# Patient Record
Sex: Male | Born: 1990 | Race: Black or African American | Hispanic: No | Marital: Single | Smoking: Never smoker
Health system: Southern US, Community
[De-identification: ages and names within clinical notes are randomized; demographics above are authoritative.]

## PROBLEM LIST (undated history)

## (undated) DIAGNOSIS — F419 Anxiety disorder, unspecified: Secondary | ICD-10-CM

## (undated) DIAGNOSIS — K219 Gastro-esophageal reflux disease without esophagitis: Secondary | ICD-10-CM

## (undated) DIAGNOSIS — F1010 Alcohol abuse, uncomplicated: Secondary | ICD-10-CM

## (undated) DIAGNOSIS — J45909 Unspecified asthma, uncomplicated: Secondary | ICD-10-CM

## (undated) HISTORY — DX: Gastro-esophageal reflux disease without esophagitis: K21.9

## (undated) HISTORY — DX: Anxiety disorder, unspecified: F41.9

## (undated) HISTORY — DX: Alcohol abuse, uncomplicated: F10.10

---
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
## 2011-01-02 LAB — CBC
HCT: 44.6 % (ref 39.0–52.0)
Hemoglobin: 14.9 g/dL (ref 13.0–17.0)
MCV: 87.4 fL (ref 78.0–100.0)
Platelets: 278 10*3/uL (ref 150–400)
RBC: 5.1 MIL/uL (ref 4.22–5.81)
RDW: 13 % (ref 11.5–15.5)
WBC: 9.7 10*3/uL (ref 4.0–10.5)

## 2011-01-02 LAB — DIFFERENTIAL
Eosinophils Absolute: 0.1 10*3/uL (ref 0.0–0.7)
Eosinophils Relative: 1 % (ref 0–5)
Monocytes Relative: 8 % (ref 3–12)
Neutro Abs: 6.6 10*3/uL (ref 1.7–7.7)

## 2011-01-02 LAB — D-DIMER, QUANTITATIVE: D-Dimer, Quant: <0.22 ug/mL-FEU (ref 0.00–0.48)

## 2011-04-13 ENCOUNTER — Emergency Department (HOSPITAL_BASED_OUTPATIENT_CLINIC_OR_DEPARTMENT_OTHER)
Admission: EM | Admit: 2011-04-13 | Discharge: 2011-04-13 | Disposition: A | Discharge status: Home / Self Care | Payer: PRIVATE HEALTH INSURANCE | Attending: Emergency Medicine | Admitting: Emergency Medicine

## 2011-04-13 DIAGNOSIS — J45909 Unspecified asthma, uncomplicated: Secondary | ICD-10-CM | POA: Insufficient documentation

## 2011-04-13 DIAGNOSIS — S90569A Insect bite (nonvenomous), unspecified ankle, initial encounter: Secondary | ICD-10-CM | POA: Insufficient documentation

## 2013-05-03 ENCOUNTER — Emergency Department (HOSPITAL_COMMUNITY)
Admission: EM | Admit: 2013-05-03 | Discharge: 2013-05-03 | Disposition: A | Discharge status: Home / Self Care | Payer: BC Managed Care – PPO | Attending: Emergency Medicine | Admitting: Emergency Medicine

## 2013-05-03 DIAGNOSIS — Z79899 Other long term (current) drug therapy: Secondary | ICD-10-CM | POA: Insufficient documentation

## 2013-05-03 DIAGNOSIS — J45909 Unspecified asthma, uncomplicated: Secondary | ICD-10-CM | POA: Insufficient documentation

## 2013-05-03 DIAGNOSIS — R110 Nausea: Secondary | ICD-10-CM

## 2013-05-03 DIAGNOSIS — M25569 Pain in unspecified knee: Secondary | ICD-10-CM | POA: Insufficient documentation

## 2013-05-03 DIAGNOSIS — R51 Headache: Secondary | ICD-10-CM | POA: Insufficient documentation

## 2013-05-03 DIAGNOSIS — M25561 Pain in right knee: Secondary | ICD-10-CM

## 2013-05-03 DIAGNOSIS — Z87891 Personal history of nicotine dependence: Secondary | ICD-10-CM | POA: Insufficient documentation

## 2013-05-03 MED ORDER — ONDANSETRON 8 MG PO TBDP
8.0000 mg | ORAL_TABLET | Freq: Once | ORAL | Status: AC
  Administered 2013-05-03: 8 mg via ORAL
  Filled 2013-05-03: qty 1

## 2013-05-03 MED ORDER — KETOROLAC TROMETHAMINE 60 MG/2ML IM SOLN
60.0000 mg | Freq: Once | INTRAMUSCULAR | Status: AC
  Administered 2013-05-03: 60 mg via INTRAMUSCULAR
  Filled 2013-05-03: qty 2

## 2013-05-03 NOTE — ED Notes (Signed)
Pt arrives with multiple list of symptoms on his iPad. Pt has multiple complaints including knee pain, nausea, headache, and forgetfulness. Pt denies any trauma to R knee. Pt also c/o nausea, but states "i won't allow myself to vomit." Pt a/o x 4. Pt ambulatory to exam room with steady gait. Pt arrives with family members.

## 2013-05-03 NOTE — ED Provider Notes (Signed)
History  This chart was scribed for non-physician practitioner Roxy Horseman, PA-C working with Raeford Razor, MD, by Candelaria Stagers, ED Scribe. This patient was seen in room WTR6/WTR6 and the patient's care was started at 8:43 PM  CSN: [id number] Arrival date & time 05/03/13  2017  First MD Initiated Contact with Patient 05/03/13 2040     Chief Complaint  Patient presents with  . Multiple Complaints    The history is provided by the patient. No language interpreter was used.   HPI Comments: Landin Tallon is a 22 y.o. male who presents to the Emergency Department complaining of a right sided headache that started about one week ago and became worse today.  He is experiencing associated nausea.  Pt also complains of right knee pain that started about one week ago.  He denies recent injury or trauma.  He reports he has seen his PCP for these complaints about 6 days ago with no relief.  Nothing seems to make the sx better or worse.  Pt is ambulatory.     No past medical history on file. No past surgical history on file. No family history on file. History  Substance Use Topics  . Smoking status: Not on file  . Smokeless tobacco: Not on file  . Alcohol Use: Not on file    Review of Systems  Gastrointestinal: Positive for nausea.  Musculoskeletal: Positive for arthralgias (right knee pain).  Neurological: Positive for headaches.  All other systems reviewed and are negative.    Allergies  Review of patient's allergies indicates not on file.  Home Medications   Current Outpatient Rx  Name  Route  Sig  Dispense  Refill  . omeprazole (PRILOSEC) 20 MG capsule   Oral   Take 20 mg by mouth daily.          BP 128/65  Pulse 67  Temp(Src) 99.2 F (37.3 C) (Oral)  Resp 16  SpO2 100% Physical Exam  Nursing note and vitals reviewed. Constitutional: He is oriented to person, place, and time. He appears well-developed and well-nourished. No distress.  HENT:  Head:  Normocephalic and atraumatic.  Right Ear: External ear normal.  Left Ear: External ear normal.  Nose: Nose normal.  Mouth/Throat: Oropharynx is clear and moist. No oropharyngeal exudate.  Eyes: Conjunctivae and EOM are normal. Pupils are equal, round, and reactive to light. Right eye exhibits no discharge. Left eye exhibits no discharge. No scleral icterus.  Neck: Normal range of motion. Neck supple. No JVD present. No tracheal deviation present.  Cardiovascular: Normal rate, regular rhythm, normal heart sounds and intact distal pulses.  Exam reveals no gallop and no friction rub.   No murmur heard. Pulmonary/Chest: Effort normal and breath sounds normal. No respiratory distress. He has no wheezes. He has no rales. He exhibits no tenderness.  Abdominal: Soft. Bowel sounds are normal. He exhibits no distension and no mass. There is no tenderness. There is no rebound and no guarding.  Musculoskeletal: Normal range of motion. He exhibits no edema and no tenderness.  Right knee nontender to palpation, no bony abnormality or deformity, range of motion and strength 5/5, patient able to ambulate appropriately  Neurological: He is alert and oriented to person, place, and time. He has normal reflexes.  CN 3-12 intact, sensation and strength intact,  Skin: Skin is warm and dry.  Psychiatric: He has a normal mood and affect. His behavior is normal. Judgment and thought content normal.    ED Course  Procedures  DIAGNOSTIC STUDIES: Oxygen Saturation is 100% on room air, normal by my interpretation.    COORDINATION OF CARE:  8:48 PM Discussed course of care with pt which includes toradol and zofran.  Advised follow up with PCP.  Pt understands and agrees.    Labs Reviewed - No data to display No results found. 1. Headache   2. Nausea   3. Knee pain, right     MDM  Patient with multiple complaints. He has a list on his eye pad involve the symptoms he has been experiencing in the past week. He  is been seen by his primary care doctor this week, who is running blood tests. Suspicious of hypochondriasis, as the patient is having multiple complaints through multiple systems. I reassured the patient that nothing emergent or life-threatening is going on, will treat the patient's nausea and pain in the emergency department. He is ambulatory. He is well appearing and not in any apparent distress. Followup with PCP. He is stable and ready for discharge.   I personally performed the services described in this documentation, which was scribed in my presence. The recorded information has been reviewed and is accurate.     Roxy Horseman, PA-C 05/03/13 2056

## 2013-05-05 ENCOUNTER — Encounter (HOSPITAL_COMMUNITY): Payer: Self-pay | Admitting: Nurse Practitioner

## 2013-05-05 ENCOUNTER — Emergency Department (HOSPITAL_COMMUNITY)
Admission: EM | Admit: 2013-05-05 | Discharge: 2013-05-06 | Disposition: A | Discharge status: Home / Self Care | Payer: BC Managed Care – PPO | Attending: Emergency Medicine | Admitting: Emergency Medicine

## 2013-05-05 ENCOUNTER — Emergency Department (HOSPITAL_COMMUNITY): Payer: BC Managed Care – PPO

## 2013-05-05 DIAGNOSIS — R109 Unspecified abdominal pain: Secondary | ICD-10-CM | POA: Insufficient documentation

## 2013-05-05 DIAGNOSIS — R42 Dizziness and giddiness: Secondary | ICD-10-CM | POA: Insufficient documentation

## 2013-05-05 DIAGNOSIS — J45909 Unspecified asthma, uncomplicated: Secondary | ICD-10-CM | POA: Insufficient documentation

## 2013-05-05 DIAGNOSIS — F29 Unspecified psychosis not due to a substance or known physiological condition: Secondary | ICD-10-CM | POA: Insufficient documentation

## 2013-05-05 DIAGNOSIS — Z79899 Other long term (current) drug therapy: Secondary | ICD-10-CM | POA: Insufficient documentation

## 2013-05-05 DIAGNOSIS — R197 Diarrhea, unspecified: Secondary | ICD-10-CM | POA: Insufficient documentation

## 2013-05-05 DIAGNOSIS — F411 Generalized anxiety disorder: Secondary | ICD-10-CM | POA: Insufficient documentation

## 2013-05-05 DIAGNOSIS — Z87891 Personal history of nicotine dependence: Secondary | ICD-10-CM | POA: Insufficient documentation

## 2013-05-05 DIAGNOSIS — R259 Unspecified abnormal involuntary movements: Secondary | ICD-10-CM | POA: Insufficient documentation

## 2013-05-05 DIAGNOSIS — F419 Anxiety disorder, unspecified: Secondary | ICD-10-CM

## 2013-05-05 DIAGNOSIS — F1010 Alcohol abuse, uncomplicated: Secondary | ICD-10-CM

## 2013-05-05 HISTORY — DX: Unspecified asthma, uncomplicated: J45.909

## 2013-05-05 LAB — CBC WITH DIFFERENTIAL/PLATELET
Basophils Absolute: 0 10*3/uL (ref 0.0–0.1)
Basophils Relative: 0 % (ref 0–1)
Eosinophils Absolute: 0 10*3/uL (ref 0.0–0.7)
HCT: 46.6 % (ref 39.0–52.0)
Hemoglobin: 16 g/dL (ref 13.0–17.0)
Monocytes Absolute: 0.7 10*3/uL (ref 0.1–1.0)
Monocytes Relative: 8 % (ref 3–12)
Neutrophils Relative %: 77 % (ref 43–77)
RDW: 13.4 % (ref 11.5–15.5)
WBC: 8.5 10*3/uL (ref 4.0–10.5)

## 2013-05-05 LAB — RAPID URINE DRUG SCREEN, HOSP PERFORMED
Amphetamines: NOT DETECTED
Barbiturates: NOT DETECTED

## 2013-05-05 LAB — BASIC METABOLIC PANEL
BUN: 7 mg/dL (ref 6–23)
CO2: 30 mEq/L (ref 19–32)
Chloride: 100 mEq/L (ref 96–112)
GFR calc Af Amer: >90 mL/min (ref >90)
Glucose, Bld: 88 mg/dL (ref 70–99)

## 2013-05-05 LAB — ACETAMINOPHEN LEVEL: Acetaminophen (Tylenol), Serum: <15 ug/mL (ref 10–30)

## 2013-05-05 LAB — ETHANOL: Alcohol, Ethyl (B): <11 mg/dL (ref 0–11)

## 2013-05-05 LAB — URINALYSIS, ROUTINE W REFLEX MICROSCOPIC
Hgb urine dipstick: NEGATIVE
Ketones, ur: NEGATIVE mg/dL
Nitrite: NEGATIVE
Specific Gravity, Urine: 1.008 (ref 1.005–1.030)
pH: 7 (ref 5.0–8.0)

## 2013-05-05 LAB — SALICYLATE LEVEL: Salicylate Lvl: <2 mg/dL — ABNORMAL LOW (ref 2.8–20.0)

## 2013-05-05 MED ORDER — LORAZEPAM 1 MG PO TABS: 1.0000 mg | ORAL_TABLET | Freq: Three times a day (TID) | ORAL | Status: DC | PRN

## 2013-05-05 MED ORDER — LORAZEPAM 2 MG/ML IJ SOLN: 2.0000 mg | Freq: Once | INTRAMUSCULAR | Status: DC

## 2013-05-05 MED ORDER — LORAZEPAM 2 MG/ML IJ SOLN
1.0000 mg | Freq: Once | INTRAMUSCULAR | Status: AC
  Administered 2013-05-05: 1 mg via INTRAVENOUS
  Filled 2013-05-05: qty 1

## 2013-05-05 MED ORDER — IBUPROFEN 800 MG PO TABS
800.0000 mg | ORAL_TABLET | Freq: Once | ORAL | Status: AC
  Administered 2013-05-05: 800 mg via ORAL
  Filled 2013-05-05: qty 1

## 2013-05-05 MED ORDER — SODIUM CHLORIDE 0.9 % IV BOLUS (SEPSIS)
1000.0000 mL | Freq: Once | INTRAVENOUS | Status: AC
  Administered 2013-05-05: 1000 mL via INTRAVENOUS

## 2013-05-05 MED ORDER — LORAZEPAM 1 MG PO TABS
1.0000 mg | ORAL_TABLET | Freq: Once | ORAL | Status: AC
  Administered 2013-05-05: 1 mg via ORAL
  Filled 2013-05-05: qty 1

## 2013-05-05 MED ORDER — ONDANSETRON HCL 4 MG/2ML IJ SOLN: 4.0000 mg | Freq: Once | INTRAMUSCULAR | Status: DC

## 2013-05-05 NOTE — ED Provider Notes (Signed)
Care assumed from [facility], [place].    George Pope is a 22 y.o. male with several days of tremors, confusion and anxiety.  Pt with 3 days binge episode ( 2 40oz and 1 pt of liquor q day).  Likely EtOH withdrawal ssx.  In with mother and brother.  No tremors on exam.  Plan: Ativan ordered.  Telepsyc ordered for discussion of anxiety meds. D/c Home with ativan (1mg  TID PRN for anxiety or withdrawal ssx). [facility] follow-up (pt is home for summer at then to [facility]).     6:21 PM Pt with some improvement in symptoms with ativan.  Pt states he still requires active calming techniques when he hears people talking "medical lingo" but is able to accomplish this himself.  Awaiting telepsyc.    10:38 PM Telepsyc recommends outpatient EtOH and Drug rehab.  ACT team consulted and will evaluate patient prior to discharge.  Will d/c home with ativan for anxiety/withdrawl symptoms and outpatient resources.    I explained the diagnosis and have given explicit precautions to return to the ER including for any other new or worsening symptoms. The patient understands and accepts the medical plan as it's been dictated and I have answered their questions. Discharge instructions concerning home care and prescriptions have been given. The patient is STABLE and is discharged to home in good condition.    Dahlia Client Averyana Pillars, PA-C 05/05/13 2239

## 2013-05-05 NOTE — ED Provider Notes (Signed)
I was available for consult during the completion of this patient's course.  Gerhard Munch, MD 05/05/13 2318

## 2013-05-05 NOTE — ED Notes (Signed)
Patient transported to CT 

## 2013-05-05 NOTE — ED Notes (Signed)
Confirmed fax number ([phone]) with Mikle Bosworth at [facility] and then faxed psych consult request form along with face sheet and triage summary.  Answered Mikle Bosworth' questions regarding patient.  Dahlia Client, [place] made aware that the telepsych papers have been faxed.

## 2013-05-05 NOTE — ED Notes (Signed)
ACT Team consult in progress.

## 2013-05-05 NOTE — Progress Notes (Signed)
Patient confirms that his pcp is at [facility].  Patient states her last name is George Pope.  EDCM confirmed there is a NP with the  last name George Pope at [facility].

## 2013-05-05 NOTE — ED Notes (Signed)
Telepsych consult in progress

## 2013-05-05 NOTE — ED Notes (Addendum)
Per Pt:  On Sunday, July 20th, pt began to have confusion associated with anxiety during a movie at the theatre.  Pt immediately went to [facility] in ambulance at [facility], where he is a senior.  The dx was dehydration.    Pt visited regular doctor two days after d/c form ED and his MD stated that all labs were fine and that he needed stop drinking per pt.  Pt admits to drinking alcohol (two 40s per day and a pint of liquor).  As of this morning, pt began feeling confused, having tremors, increased anxiety and right-sided temporal throbbing headache.   Addendum:  Pt admitted to drinking because he has lost several friends recently and he also drinks to help his abdominal pain and heart palpitations which have been occurring past 6 months.  Pt recently dx with IBS and taking prilosec, he thinks.

## 2013-05-05 NOTE — ED Provider Notes (Signed)
History    CSN: [id number] Arrival date & time 05/05/13  1231  First MD Initiated Contact with Patient 05/05/13 1302     Chief Complaint  Patient presents with  . Nausea  . Emesis  . Diarrhea  . Abdominal Cramping   (Consider location/radiation/quality/duration/timing/severity/associated sxs/prior Treatment) HPI Comments: Patient is a 22 year old male with a past medical history of anxiety and asthma who presents with anxiety and tremors for the past 3 days. Symptoms started gradually and have been intermittent over the past 3 days without known trigger. Patient reports period of confusion and an "aura" prior to tremors. The symptoms resolve spontaneously. Patient reports a 3 day episode of binge drinking starting about 1 week ago. Patient reports drinking 2-40 oz beers and 1 pint of liquor. Patient states his anxiety has been worse in the past 3 days than ever before. Patient also reports having 2 friends pass away in the past 6 months. Patient does not currently take medication for anxiety.   Patient is a 22 y.o. male presenting with vomiting, diarrhea, and cramps.  Emesis Associated symptoms: diarrhea   Diarrhea Associated symptoms: vomiting   Abdominal Cramping Associated symptoms include vomiting.   Past Medical History  Diagnosis Date  . Asthma     intermittent   History reviewed. No pertinent past surgical history. History reviewed. No pertinent family history. History  Substance Use Topics  . Smoking status: Former Games developer  . Smokeless tobacco: Former Neurosurgeon  . Alcohol Use: Yes     Comment: two 40oz and 1 pint liquor per day    Review of Systems  Gastrointestinal: Positive for vomiting and diarrhea.  Neurological: Positive for tremors and light-headedness.  Psychiatric/Behavioral: Positive for confusion. The patient is nervous/anxious.   All other systems reviewed and are negative.    Allergies  Review of patient's allergies indicates no known  allergies.  Home Medications   Current Outpatient Rx  Name  Route  Sig  Dispense  Refill  . omeprazole (PRILOSEC) 20 MG capsule   Oral   Take 20 mg by mouth daily.         . Pediatric Multiple Vit-C-FA (FLINSTONES GUMMIES OMEGA-3 DHA PO)   Oral   Take 1 tablet by mouth daily.          BP 135/67  Pulse 85  Temp(Src) 98.2 F (36.8 C) (Oral)  Resp 18  SpO2 100% Physical Exam  Nursing note and vitals reviewed. Constitutional: He is oriented to person, place, and time. He appears well-developed and well-nourished. No distress.  HENT:  Head: Normocephalic and atraumatic.  Mouth/Throat: Oropharynx is clear and moist. No oropharyngeal exudate.  Eyes: Conjunctivae and EOM are normal. Pupils are equal, round, and reactive to light.  Neck: Normal range of motion.  Cardiovascular: Normal rate and regular rhythm.  Exam reveals no gallop and no friction rub.   No murmur heard. Pulmonary/Chest: Effort normal and breath sounds normal. He has no wheezes. He has no rales. He exhibits no tenderness.  Abdominal: Soft. He exhibits no distension. There is no tenderness. There is no rebound and no guarding.  Musculoskeletal: Normal range of motion.  Neurological: He is alert and oriented to person, place, and time. No cranial nerve deficit. Coordination normal.  Speech is goal-oriented. Moves limbs without ataxia.   Skin: Skin is warm and dry.  Psychiatric: He has a normal mood and affect. His behavior is normal.    ED Course  Procedures (including critical care time) Labs Reviewed  SALICYLATE LEVEL - Abnormal; Notable for the following:    Salicylate Lvl <2.0 (*)    All other components within normal limits  CBC WITH DIFFERENTIAL  BASIC METABOLIC PANEL  URINE RAPID DRUG SCREEN (HOSP PERFORMED)  URINALYSIS, ROUTINE W REFLEX MICROSCOPIC  ETHANOL  ACETAMINOPHEN LEVEL   No results found.  1. Alcohol abuse   2. Anxiety     MDM  2:01 PM Labs and urinalysis pending. CT head  pending. No neuro deficits. Vitals stable and patient afebrile.   Patient will have telepsych evaluation. Patient signed out to [facility], PA-C.   Emilia Beck, PA-C 05/08/13 [phone]

## 2013-05-05 NOTE — ED Notes (Signed)
PA at bedside.

## 2013-05-06 NOTE — BH Assessment (Signed)
Assessment Note   George Pope is an 22 y.o. male, single African-American who presents to [facility] accompanied by his mother and younger brother, who at the Pt's request did not participate in the assessment. Pt reports he came to the emergency department because he was worried that there was something medically wrong with him due to the way he has been feeling lately. He states that 9 days ago he was drinking alcohol and became disoriented, confused and felt there was something physically wrong. He has been seen medically three times within the past 9 days and there is no indication of anything medical. Pt states he feels very anxious and is concerned because he is tremulous. He reports he has not drank alcohol in 9 days. He states he tried marijuana once in 10th grade and was also given "laughing gas" once but denies any other substance abuse. He denies depressive symptoms. He denies past or current suicidal ideation or self-harm. He denies homicidal ideation or a history of violence. He denies any psychotic symptoms.  Pt appear very anxious with tremulous voice and hands. His speech is rapid but not pressured. He answers all questions in great detail in an effort to fully explain his symptoms but thought process is coherent and goal directed. He describes several stressors including transferring from [facility] to [place], moving back in with his mother and younger brother, looking for employment and feeling pressure to meet his own expectations. He also was informed by a former sexual partner that she is pregnant and he may be the father (his mother does not know this).  Pt has had no inpatient or outpatient mental health or substance abuse treatment. He does not know of any family history of mental health problems but states he never knew his father. He states his uncle and grandfather have had alcohol problems.   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 Asthma     intermittent   Axis IV: other psychosocial or environmental problems Axis V: GAF=55  Past Medical History:  Past Medical History  Diagnosis Date  . Asthma     intermittent    History reviewed. No pertinent past surgical history.  Family History: History reviewed. No pertinent family history.  Social History:  reports that he has quit smoking. He has quit using smokeless tobacco. He reports that  drinks alcohol. He reports that he uses illicit drugs (Marijuana).  Additional Social History:  Alcohol / Drug Use Pain Medications: Denies Prescriptions: Denies Over the Counter: Denies History of alcohol / drug use?: Yes (Pt reports trying marijuana once in 10th grade) Longest period of sobriety (when/how long): 2 weeks Negative Consequences of Use:  (Denies) Withdrawal Symptoms:  (Denies) Substance #1 Name of Substance 1: Alcohol 1 - Age of First Use: 20 1 - Amount (size/oz): 2-4 40oz beers 1 - Frequency: 1-2 times per week 1 - Duration: 1 year 1 - Last Use / Amount: 9 days ago  CIWA: CIWA-Ar BP: 132/55 mmHg Pulse Rate: 56 COWS:    Allergies: No Known Allergies  Home Medications:  (Not in a hospital admission)  OB/GYN Status:  No LMP for male patient.  General Assessment Data Location of Assessment: [facility] Assessment Services Living Arrangements: Other (Comment);Parent (Mother, younger brother) Can pt return to current living arrangement?: Yes Admission Status: Voluntary Is patient capable of signing voluntary admission?: Yes Transfer from: Home Referral Source: Self/Family/Friend  Education Status Is patient currently in school?: Yes Current Grade: 14 Highest grade of school patient has completed: 15 Name of school: [employer] person: Unknown  Risk to self Suicidal Ideation: No Suicidal Intent: No Is patient at risk for suicide?: No Suicidal Plan?: No Access to Means:  No What has been your use of drugs/alcohol within the last 12 months?: Pt uses alcohol regularly but not daily Previous Attempts/Gestures: No How many times?: 0 Other Self Harm Risks: None Triggers for Past Attempts: None known Intentional Self Injurious Behavior: None Family Suicide History: No Recent stressful life event(s): Other (Comment) (Changed school) Persecutory voices/beliefs?: No Depression: No Depression Symptoms:  (Pt denies symptoms) Substance abuse history and/or treatment for substance abuse?: Yes Suicide prevention information given to non-admitted patients: Yes  Risk to Others Homicidal Ideation: No Thoughts of Harm to Others: No Current Homicidal Intent: No Current Homicidal Plan: No Access to Homicidal Means: No Identified Victim: None History of harm to others?: No Assessment of Violence: None Noted Violent Behavior Description: None Does patient have access to weapons?: No Criminal Charges Pending?: No Does patient have a court date: No  Psychosis Hallucinations: None noted Delusions: None noted  Mental Status Report Appear/Hygiene: Other (Comment) (Casually dressed) Eye Contact: Good Motor Activity: Tremors;Freedom of movement Speech: Logical/coherent;Other (Comment) (Fast) Level of Consciousness: Alert Mood: Anxious Affect: Anxious Anxiety Level: Severe Thought Processes: Coherent;Relevant Judgement: Unimpaired Orientation: Person;Place;Time;Situation;Appropriate for developmental age Obsessive Compulsive Thoughts/Behaviors: None  Cognitive Functioning Concentration: Normal Memory: Recent Intact;Remote Intact IQ: Average Insight: Fair Impulse Control: Good Appetite: Good Weight Loss: 0 Weight Gain: 0 Sleep: No Change Total Hours of Sleep: 8 Vegetative Symptoms: None  ADLScreening [facility] Assessment Services) Patient's cognitive ability adequate to safely complete daily activities?: Yes Patient able to express need for assistance with  ADLs?: Yes Independently performs ADLs?: Yes (appropriate for developmental age)  Abuse/Neglect [facility]) Physical Abuse: Denies Verbal Abuse: Denies Sexual Abuse: Denies  Prior Inpatient Therapy Prior Inpatient Therapy: No Prior Therapy Dates: NA Prior Therapy Facilty/Provider(s): NA Reason for Treatment: NA  Prior Outpatient Therapy Prior Outpatient Therapy: No Prior Therapy Dates: NA Prior Therapy Facilty/Provider(s): NA Reason for Treatment: NA  ADL Screening (condition at time of admission) Patient's cognitive ability adequate to safely complete daily activities?: Yes Is the patient deaf or have difficulty hearing?: No Does the patient have difficulty seeing, even when wearing glasses/contacts?: No Does the patient have difficulty concentrating, remembering, or making decisions?: No Patient able to express need for assistance with ADLs?: Yes Does the patient have difficulty dressing or bathing?: No Independently performs ADLs?: Yes (appropriate for developmental age) Weakness of Legs: None Weakness of Arms/Hands: None  Home Assistive Devices/Equipment Home Assistive Devices/Equipment: None    Abuse/Neglect Assessment (Assessment to be complete while patient is alone) Physical Abuse: Denies Verbal Abuse: Denies Sexual Abuse: Denies Exploitation of patient/patient's resources: Denies Self-Neglect: Denies Values / Beliefs Cultural Requests During Hospitalization: None Spiritual Requests During Hospitalization: None   Advance Directives (For Healthcare) Advance Directive: Patient does not have advance directive;Patient would not like information Pre-existing out of facility DNR order (yellow form or pink MOST form): No Nutrition Screen- [facility] Adult/[facility] Patient's home diet: Regular  Additional Information 1:1 In Past 12 Months?: No CIRT Risk: No Elopement Risk: No Does patient have medical clearance?: Yes     Disposition:  Disposition Initial Assessment  Completed for this Encounter: Yes Disposition of Patient: Outpatient treatment Type of outpatient treatment: Adult  On Site Evaluation by:   Reviewed with Physician: Pt  evaluated by Telepsychiatry  Pt does not present with any imminent safety issues. Referred Pt to individual outpatient therapy for treatment of anxiety. Recommended Pt contact the mental health access line of [employer] for in-network provider. Also gave Pt list of private therapists and 24-hour crisis numbers.   Patsy Baltimore, Harlin Rain 05/06/2013 12:48 AM

## 2013-05-07 NOTE — ED Provider Notes (Signed)
Medical screening examination/treatment/procedure(s) were performed by non-physician practitioner and as supervising physician I was immediately available for consultation/collaboration.  Jaya Lapka, MD 05/07/13 2324 

## 2013-05-09 NOTE — ED Provider Notes (Signed)
Medical screening examination/treatment/procedure(s) were performed by non-physician practitioner and as supervising physician I was immediately available for consultation/collaboration.  Raeford Razor, MD 05/09/13 [phone]

## 2013-11-30 IMAGING — CT CT HEAD W/O CM
2 series · 17 of 30 positions shown, 20 images · non-contrast
Comparison: None.

CLINICAL DATA: Tremor and anxiety.  Confusion.

CT HEAD WITHOUT CONTRAST
TECHNIQUE: Contiguous axial images were obtained from the base of
the skull through the vertex without contrast.

[Series 2: head w/o · axial · non-contrast · 0.45mm/px · z∈[-144,-24]mm · 9 of 31 slices shown, 12 images]
[im 4/31  brain]
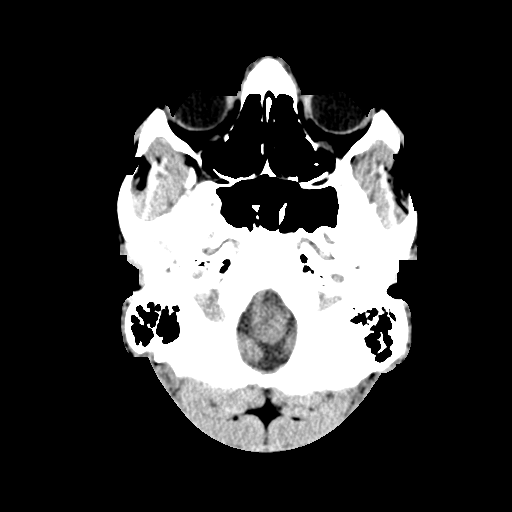
[im 4/31  bone]
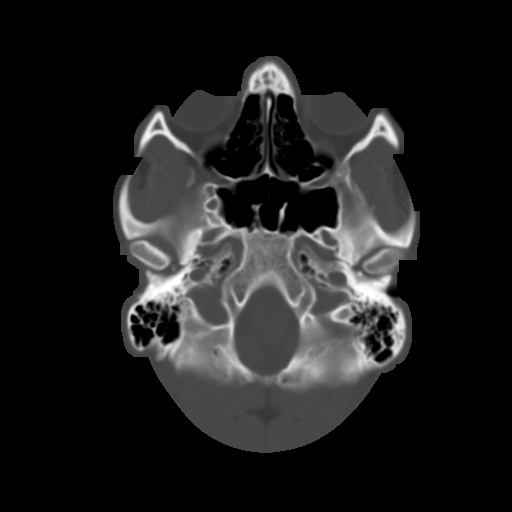
[im 7/31  brain]
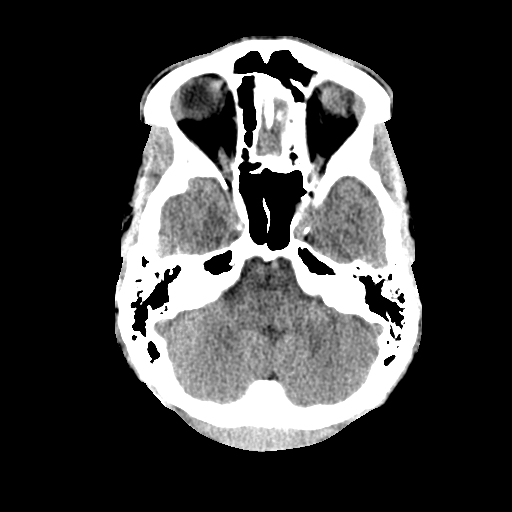
[im 10/31  brain]
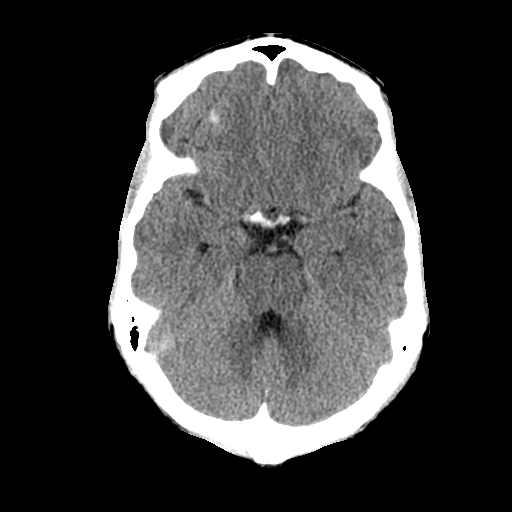
[im 13/31  brain]
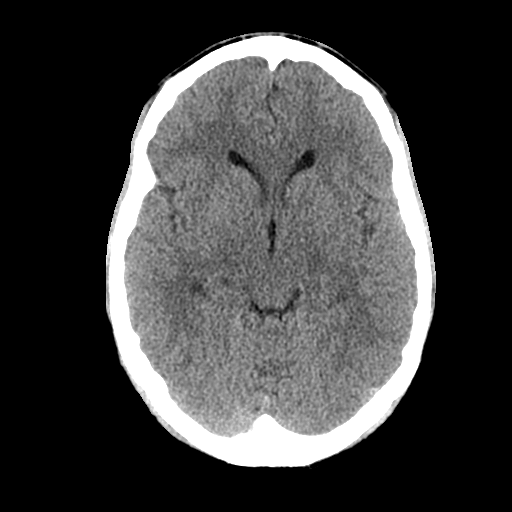
[im 16/31  brain]
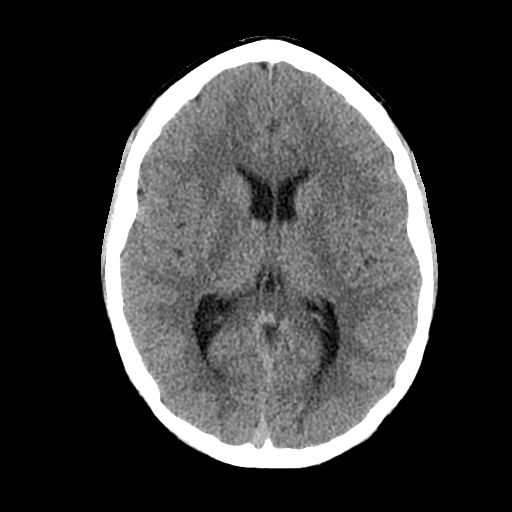
[im 16/31  bone]
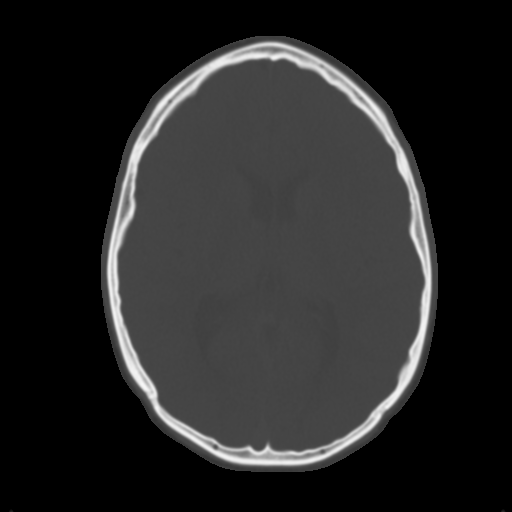
[im 19/31  brain]
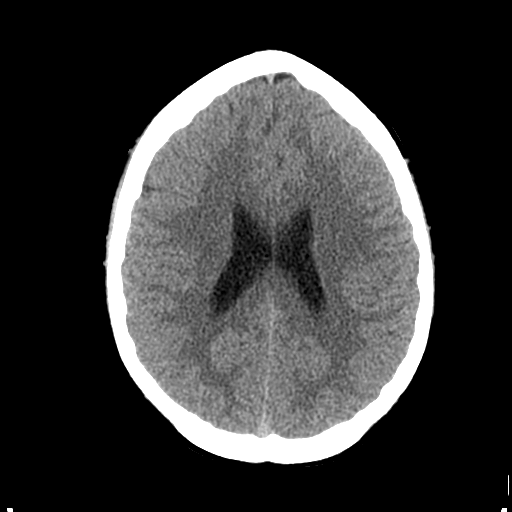
[im 22/31  brain]
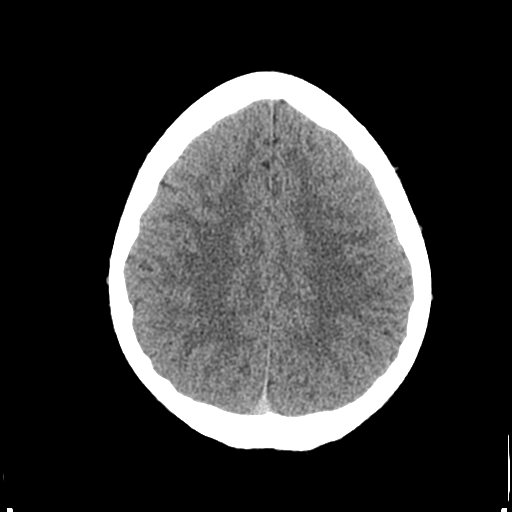
[im 25/31  brain]
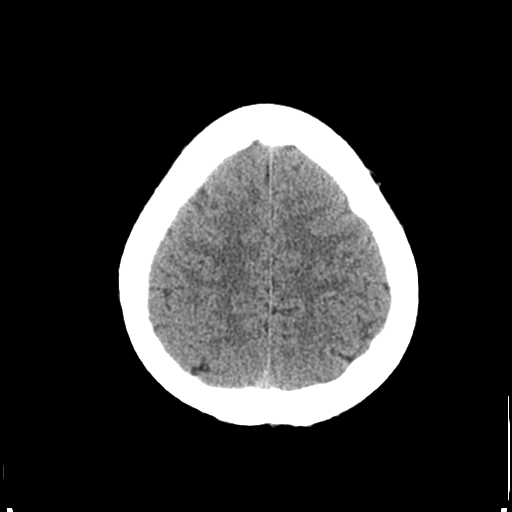
[im 28/31  brain]
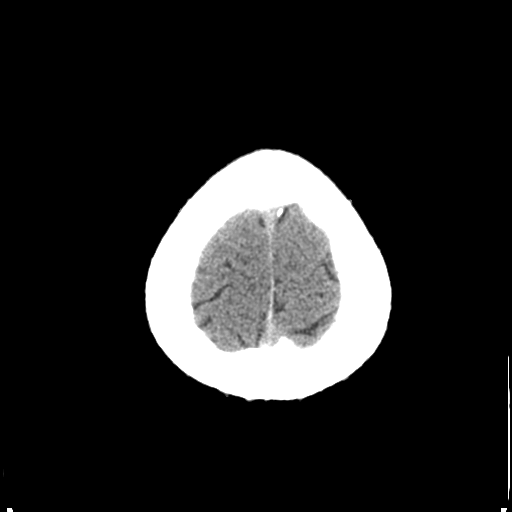
[im 28/31  bone]
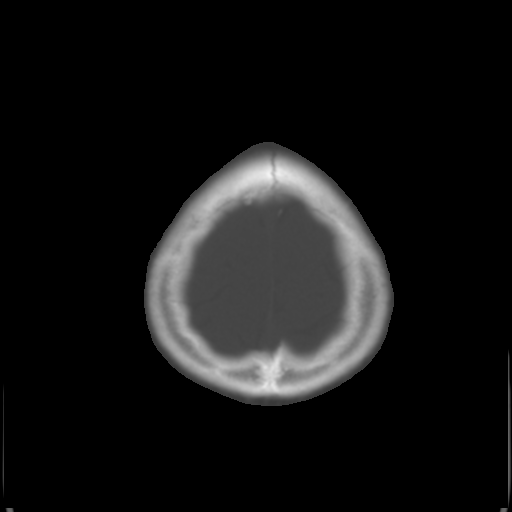

[Series 3: bone windows · axial · 0.45mm/px · z∈[-144,-24]mm · 8 of 52 slices shown]
[im 6/52  bone]
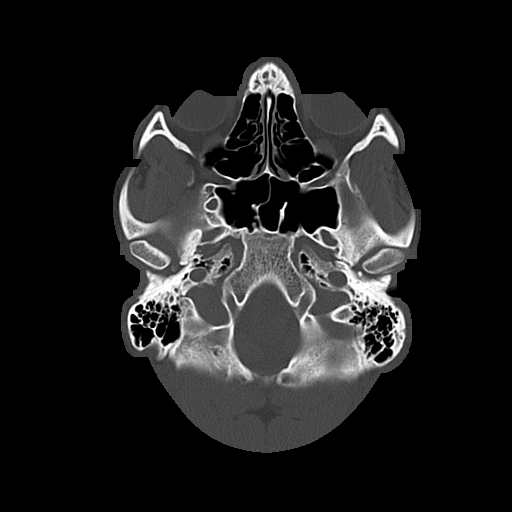
[im 12/52  bone]
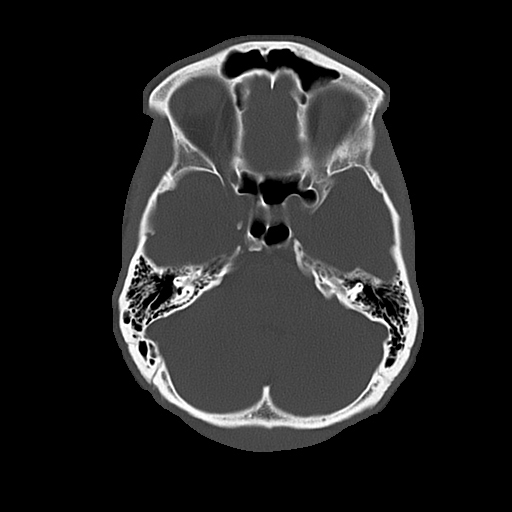
[im 18/52  bone]
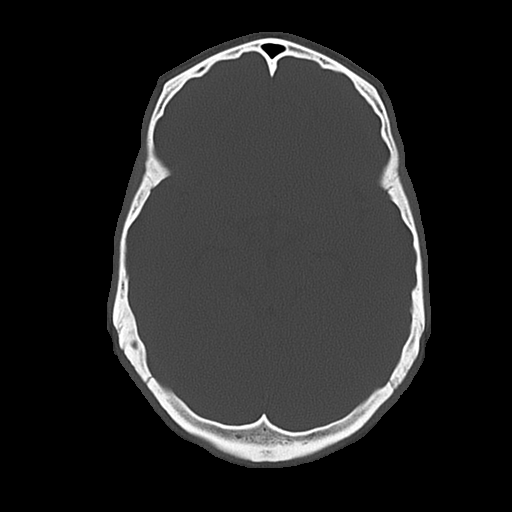
[im 23/52  bone]
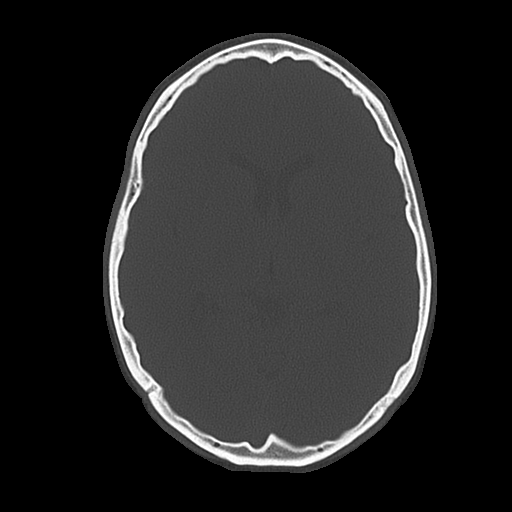
[im 29/52  bone]
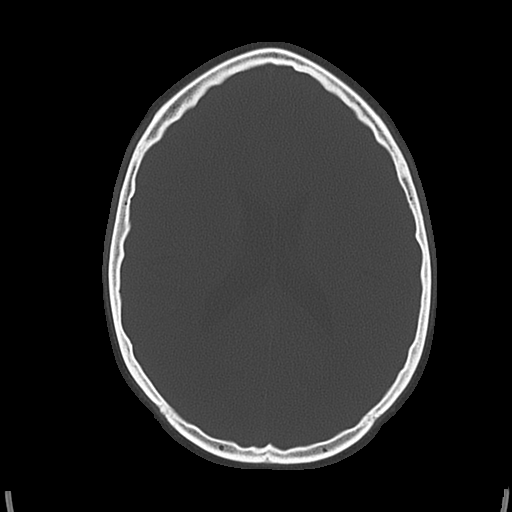
[im 35/52  bone]
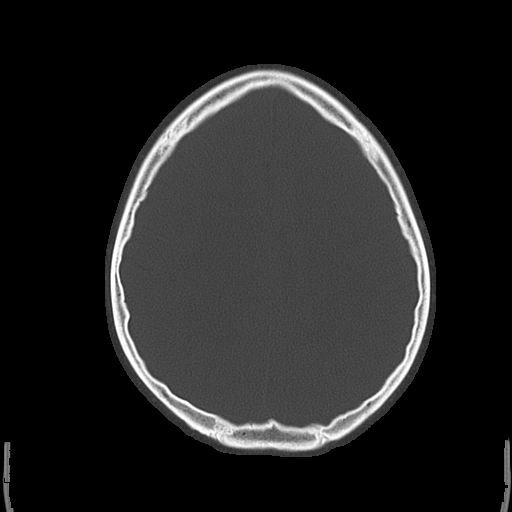
[im 40/52  bone]
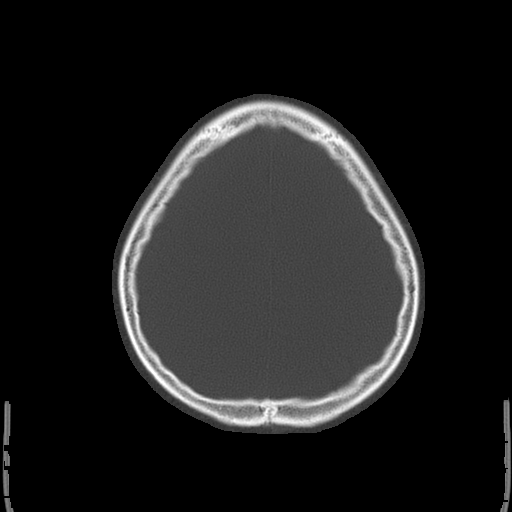
[im 46/52  bone]
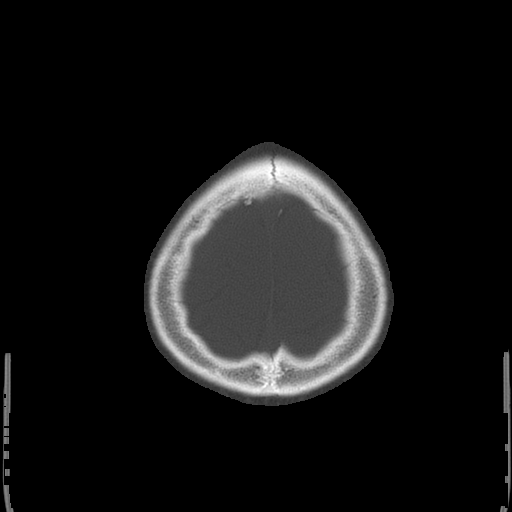

[17 of 30 positions shown; findings below may reference images not displayed]

FINDINGS: No mass effect, midline shift, or acute intracranial
hemorrhage.  Brain parenchyma, extraaxial space, and ventricles are
within normal limits.  Mastoid air cells are clear.  Intact
cranium.
IMPRESSION: Negative head CT.

## 2013-12-30 ENCOUNTER — Emergency Department (HOSPITAL_COMMUNITY): Payer: BC Managed Care – PPO

## 2013-12-30 ENCOUNTER — Emergency Department (HOSPITAL_COMMUNITY)
Admission: EM | Admit: 2013-12-30 | Discharge: 2013-12-30 | Disposition: A | Discharge status: Home / Self Care | Payer: BC Managed Care – PPO | Attending: Emergency Medicine | Admitting: Emergency Medicine

## 2013-12-30 ENCOUNTER — Encounter (HOSPITAL_COMMUNITY): Payer: Self-pay | Admitting: Emergency Medicine

## 2013-12-30 DIAGNOSIS — B9789 Other viral agents as the cause of diseases classified elsewhere: Secondary | ICD-10-CM

## 2013-12-30 DIAGNOSIS — J069 Acute upper respiratory infection, unspecified: Secondary | ICD-10-CM

## 2013-12-30 DIAGNOSIS — J45901 Unspecified asthma with (acute) exacerbation: Secondary | ICD-10-CM | POA: Insufficient documentation

## 2013-12-30 MED ORDER — PSEUDOEPHEDRINE HCL ER 120 MG PO TB12
120.0000 mg | ORAL_TABLET | Freq: Two times a day (BID) | ORAL | Status: DC
  Administered 2013-12-30: 120 mg via ORAL
  Filled 2013-12-30 (×3): qty 1

## 2013-12-30 NOTE — ED Provider Notes (Signed)
Medical screening examination/treatment/procedure(s) were performed by non-physician practitioner and as supervising physician I was immediately available for consultation/collaboration.   Dione Boozeavid Maryjayne Kleven, MD 12/30/13 [phone]

## 2013-12-30 NOTE — ED Notes (Signed)
Per pt report: pt c/o of congestion x 1 week.  Pt had a sore throat yesterday but that symptom has improved. Pt also endorses a weak non productive cough.  Pt a/o x 4. Skin warm and dry.  Ambulatory in triage.

## 2013-12-30 NOTE — Discharge Instructions (Signed)
Take sudafed twice a day for nasal congestion and post-nasal drip.  Drink plenty of fluids.  Please return to the ER if your shortness of breath worsens.

## 2013-12-30 NOTE — ED Provider Notes (Signed)
CSN: [id number]     Arrival date & time 12/30/13  0121 History   First MD Initiated Contact with Patient 12/30/13 0200     Chief Complaint  Patient presents with  . Nasal Congestion     (Consider location/radiation/quality/duration/timing/severity/associated sxs/prior Treatment) HPI History provided by pt.   Pt presents w/ cough x 1 week.  Associated w/ sore throat since onset that has improved, nasal congestion, post-nasal drip, and recently SOB that occurs in supine position and may be related to build up of mucous.  Denies f/c, sinus pressure, chest pain, N/V/D, rash.  No improvement in cough/SOB with dayquil. No known sick contacts.  No PMH.  Past Medical History  Diagnosis Date  . Asthma     intermittent   History reviewed. No pertinent past surgical history. No family history on file. History  Substance Use Topics  . Smoking status: Never Smoker   . Smokeless tobacco: Former NeurosurgeonUser  . Alcohol Use: 2.0 oz/week    4 drink(s) per week    Review of Systems  All other systems reviewed and are negative.      Allergies  Review of patient's allergies indicates no known allergies.  Home Medications   Current Outpatient Rx  Name  Route  Sig  Dispense  Refill  . Multiple Vitamin (MULTIVITAMIN WITH MINERALS) TABS tablet   Oral   Take 1 tablet by mouth daily.         Marland Kitchen. omeprazole (PRILOSEC) 20 MG capsule   Oral   Take 20 mg by mouth daily as needed (heart burn).          . Pseudoephedrine-APAP-DM (DAYQUIL MULTI-SYMPTOM COLD/FLU PO)   Oral   Take 30 mLs by mouth every 6 (six) hours as needed (cough).         . triamcinolone (NASACORT AQ) 55 MCG/ACT AERO nasal inhaler   Nasal   Place 2 sprays into the nose daily as needed (allergies).          BP 131/54  Pulse 64  Temp(Src) 97.7 F (36.5 C) (Oral)  Resp 18  Ht 6' (1.829 m)  Wt 170 lb (77.111 kg)  BMI 23.05 kg/m2  SpO2 100% Physical Exam  Nursing note and vitals reviewed. Constitutional: He is oriented  to person, place, and time. He appears well-developed and well-nourished. No distress.  HENT:  Head: Normocephalic and atraumatic.  No erythema of pharynx or tonsillar edema/exudate.  Nasal congestion.    Eyes:  Normal appearance  Neck: Normal range of motion.  Cardiovascular: Normal rate and regular rhythm.   Pulmonary/Chest: Effort normal. No respiratory distress.  diminished breath sounds R lung base.  Coughing.   Musculoskeletal: Normal range of motion.  Lymphadenopathy:    He has no cervical adenopathy.  Neurological: He is alert and oriented to person, place, and time.  Skin: Skin is warm and dry. No rash noted.  Psychiatric: He has a normal mood and affect. His behavior is normal.    ED Course  Procedures (including critical care time) Labs Review Labs Reviewed - No data to display Imaging Review Dg Chest 2 View  12/30/2013   CLINICAL DATA:  Cough and shortness of breath.  EXAM: CHEST  2 VIEW  COMPARISON:  PA and lateral chest 02/15/2010.  FINDINGS: Heart size and mediastinal contours are within normal limits. Both lungs are clear. Visualized skeletal structures are unremarkable.  IMPRESSION: Negative exam.   Electronically Signed   By: Drusilla Kannerhomas  Dalessio M.D.   On: 12/30/2013 03:05  EKG Interpretation None      MDM   Final diagnoses:  Viral URI with cough    23yo healthy M presents w/ 1 week cough and SOB (in supine position only).  Significant exam findings; afebrile, no respiratory distress, diminished breath sounds R lung base, coughing.  Suspect that described SOB is related to post-nasal drip but CXR ordered to r/o pna.  Sudafed to be administered. 3:10 AM   CXR neg.  Results discussed w/ pt. Will treat symptomatically.  Recommended that he continue sudafed, take tylenol/motrin prn and drink plenty of fluids home.  Return precautions discussed. 3:56 AM     Otilio Miu, PA-C 12/30/13 [phone]

## 2013-12-30 NOTE — ED Notes (Signed)
Bed: WA04 Expected date:  Expected time:  Means of arrival:  Comments: 

## 2014-04-07 ENCOUNTER — Emergency Department (HOSPITAL_COMMUNITY)
Admission: EM | Admit: 2014-04-07 | Discharge: 2014-04-08 | Disposition: A | Discharge status: Home / Self Care | Payer: BC Managed Care – PPO | Attending: Emergency Medicine | Admitting: Emergency Medicine

## 2014-04-07 ENCOUNTER — Encounter (HOSPITAL_COMMUNITY): Payer: Self-pay | Admitting: Emergency Medicine

## 2014-04-07 DIAGNOSIS — K2100 Gastro-esophageal reflux disease with esophagitis, without bleeding: Secondary | ICD-10-CM | POA: Insufficient documentation

## 2014-04-07 DIAGNOSIS — Z79899 Other long term (current) drug therapy: Secondary | ICD-10-CM | POA: Insufficient documentation

## 2014-04-07 DIAGNOSIS — J45909 Unspecified asthma, uncomplicated: Secondary | ICD-10-CM | POA: Insufficient documentation

## 2014-04-07 DIAGNOSIS — K219 Gastro-esophageal reflux disease without esophagitis: Secondary | ICD-10-CM

## 2014-04-07 NOTE — ED Notes (Signed)
Pt states he has acid reflux and it has gotten worse  Pt states he just finished a 14 day treatment but it didn't help  Pt states he feels like he is gagging and going to vomit  No acute distress noted

## 2014-04-07 NOTE — ED Provider Notes (Signed)
CSN: [id number]     Arrival date & time 04/07/14  2138 History  This chart was scribed for non-physician practitioner, Ivonne AndrewPeter Dammen, PA-C,working with Ethelda ChickMartha K Linker, MD, by Karle PlumberJennifer Tensley, ED Scribe.  This patient was seen in room WTR8/WTR8 and the patient's care was started at 11:56 PM.  Chief Complaint  Patient presents with  . Gastrophageal Reflux   The history is provided by the patient. No language interpreter was used.   HPI Comments:  George Pope is a 23 y.o. male who presents to the Emergency Department complaining of nausea and gagging from acid reflux that started worsening earlier today. Pt states he received a barium swallow test a few months ago and was diagnosed with GERD. He states he just finished a 14 day treatment of Omeprazole and states it did seem to help some however after finishing he now has worsening symptoms with associated belching and bloating. He states he was evaluated for the abdominal pain about one month ago and was told he had a lot of gas and stool and was treated with laxatives. Denies diarrhea constipation now. He reports he has a good appetite as long as he is not feeling nauseous. He denies fever or chills.   Past Medical History  Diagnosis Date  . Asthma     intermittent  . Acid reflux    History reviewed. No pertinent past surgical history. History reviewed. No pertinent family history. History  Substance Use Topics  . Smoking status: Never Smoker   . Smokeless tobacco: Former NeurosurgeonUser  . Alcohol Use: Yes     Comment: occ    Review of Systems  Constitutional: Negative for fever and chills.  Gastrointestinal: Positive for nausea. Negative for abdominal pain.  All other systems reviewed and are negative.   Allergies  Review of patient's allergies indicates no known allergies.  Home Medications   Prior to Admission medications   Medication Sig Start Date End Date Taking? Authorizing Provider  Multiple Vitamin (MULTIVITAMIN WITH MINERALS)  TABS tablet Take 1 tablet by mouth daily.    Historical Provider, MD  omeprazole (PRILOSEC) 20 MG capsule Take 20 mg by mouth daily as needed (heart burn).     Historical Provider, MD  Pseudoephedrine-APAP-DM (DAYQUIL MULTI-SYMPTOM COLD/FLU PO) Take 30 mLs by mouth every 6 (six) hours as needed (cough).    Historical Provider, MD  triamcinolone (NASACORT AQ) 55 MCG/ACT AERO nasal inhaler Place 2 sprays into the nose daily as needed (allergies).    Historical Provider, MD   Triage Vitals: BP 132/81  Pulse 83  Temp(Src) 98.3 F (36.8 C) (Oral)  Resp 18  Ht 6' (1.829 m)  Wt 170 lb (77.111 kg)  BMI 23.05 kg/m2  SpO2 100% Physical Exam  Nursing note and vitals reviewed. Constitutional: He is oriented to person, place, and time. He appears well-developed and well-nourished.  HENT:  Head: Normocephalic and atraumatic.  Mouth/Throat: Oropharynx is clear and moist.  Eyes: EOM are normal.  Neck: Normal range of motion.  Cardiovascular: Normal rate.   Pulmonary/Chest: Effort normal. No respiratory distress. He has no wheezes. He has no rales.  Abdominal: Soft. There is tenderness in the right upper quadrant and epigastric area. There is no rigidity, no rebound, no guarding, no tenderness at McBurney's point and negative Murphy's sign.  Tenderness is mild  Musculoskeletal: Normal range of motion.  Neurological: He is alert and oriented to person, place, and time.  Skin: Skin is warm and dry.  Psychiatric: He has a normal  mood and affect. His behavior is normal.    ED Course  Procedures  DIAGNOSTIC STUDIES: Oxygen Saturation is 100% on RA, normal by my interpretation.   COORDINATION OF CARE: 12:10 AM- Will give referral for gastroenterology. Will prescribe medications for acid reflux and advised pt to continue taking the Miralax he was prescribed previously. Pt verbalizes understanding and agrees to plan.   MDM   Final diagnoses:  Gastroesophageal reflux disease, esophagitis presence  not specified      I personally performed the services described in this documentation, which was scribed in my presence. The recorded information has been reviewed and is accurate.    Angus SellerPeter S Dammen, PA-C 04/08/14 [phone]

## 2014-04-07 NOTE — ED Notes (Signed)
Pt ambulatory to exam room with steady gait.  

## 2014-04-08 ENCOUNTER — Encounter: Payer: Self-pay | Admitting: Internal Medicine

## 2014-04-08 MED ORDER — SUCRALFATE 1 GM/10ML PO SUSP: 1.0000 g | Freq: Three times a day (TID) | ORAL | Status: AC

## 2014-04-08 MED ORDER — OMEPRAZOLE 20 MG PO CPDR: 20.0000 mg | DELAYED_RELEASE_CAPSULE | Freq: Every day | ORAL | Status: AC

## 2014-04-08 MED ORDER — FAMOTIDINE 20 MG PO TABS: 20.0000 mg | ORAL_TABLET | Freq: Two times a day (BID) | ORAL | Status: AC

## 2014-04-08 NOTE — Discharge Instructions (Signed)
Please use the medications prescribed to help with your symptoms of acid reflux. Followup with a primary care provider or GI specialist for continued evaluation and treatment. Return at any time for changing or worsening symptoms.   Gastroesophageal Reflux Disease, Adult Gastroesophageal reflux disease (GERD) happens when acid from your stomach flows up into the esophagus. When acid comes in contact with the esophagus, the acid causes soreness (inflammation) in the esophagus. Over time, GERD may create small holes (ulcers) in the lining of the esophagus. CAUSES   Increased body weight. This puts pressure on the stomach, making acid rise from the stomach into the esophagus.  Smoking. This increases acid production in the stomach.  Drinking alcohol. This causes decreased pressure in the lower esophageal sphincter (valve or ring of muscle between the esophagus and stomach), allowing acid from the stomach into the esophagus.  Late evening meals and a full stomach. This increases pressure and acid production in the stomach.  A malformed lower esophageal sphincter. Sometimes, no cause is found. SYMPTOMS   Burning pain in the lower part of the mid-chest behind the breastbone and in the mid-stomach area. This may occur twice a week or more often.  Trouble swallowing.  Sore throat.  Dry cough.  Asthma-like symptoms including chest tightness, shortness of breath, or wheezing. DIAGNOSIS  Your caregiver may be able to diagnose GERD based on your symptoms. In some cases, X-rays and other tests may be done to check for complications or to check the condition of your stomach and esophagus. TREATMENT  Your caregiver may recommend over-the-counter or prescription medicines to help decrease acid production. Ask your caregiver before starting or adding any new medicines.  HOME CARE INSTRUCTIONS   Change the factors that you can control. Ask your caregiver for guidance concerning weight loss, quitting  smoking, and alcohol consumption.  Avoid foods and drinks that make your symptoms worse, such as:  Caffeine or alcoholic drinks.  Chocolate.  Peppermint or mint flavorings.  Garlic and onions.  Spicy foods.  Citrus fruits, such as oranges, lemons, or limes.  Tomato-based foods such as sauce, chili, salsa, and pizza.  Fried and fatty foods.  Avoid lying down for the 3 hours prior to your bedtime or prior to taking a nap.  Eat small, frequent meals instead of large meals.  Wear loose-fitting clothing. Do not wear anything tight around your waist that causes pressure on your stomach.  Raise the head of your bed 6 to 8 inches with wood blocks to help you sleep. Extra pillows will not help.  Only take over-the-counter or prescription medicines for pain, discomfort, or fever as directed by your caregiver.  Do not take aspirin, ibuprofen, or other nonsteroidal anti-inflammatory drugs (NSAIDs). SEEK IMMEDIATE MEDICAL CARE IF:   You have pain in your arms, neck, jaw, teeth, or back.  Your pain increases or changes in intensity or duration.  You develop nausea, vomiting, or sweating (diaphoresis).  You develop shortness of breath, or you faint.  Your vomit is green, yellow, black, or looks like coffee grounds or blood.  Your stool is red, bloody, or black. These symptoms could be signs of other problems, such as heart disease, gastric bleeding, or esophageal bleeding. MAKE SURE YOU:   Understand these instructions.  Will watch your condition.  Will get help right away if you are not doing well or get worse. Document Released: 07/11/2005 Document Revised: 12/24/2011 Document Reviewed: 04/20/2011 [facility] Patient Information 2015 [place], [place]. This information is not intended to replace  advice given to you by your health care provider. Make sure you discuss any questions you have with your health care provider. ° °

## 2014-04-08 NOTE — ED Provider Notes (Signed)
Medical screening examination/treatment/procedure(s) were performed by non-physician practitioner and as supervising physician I was immediately available for consultation/collaboration.   EKG Interpretation None       Ethelda ChickMartha K Linker, MD 04/08/14 [phone]

## 2014-06-16 ENCOUNTER — Ambulatory Visit: Payer: BC Managed Care – PPO | Admitting: Internal Medicine

## 2014-07-27 IMAGING — CR DG CHEST 2V
2 series · 2 of 2 positions shown · non-contrast
Comparison: PA and lateral chest 02/15/2010.

CLINICAL DATA: Cough and shortness of breath.

EXAM:
CHEST  2 VIEW

[w chest pa]
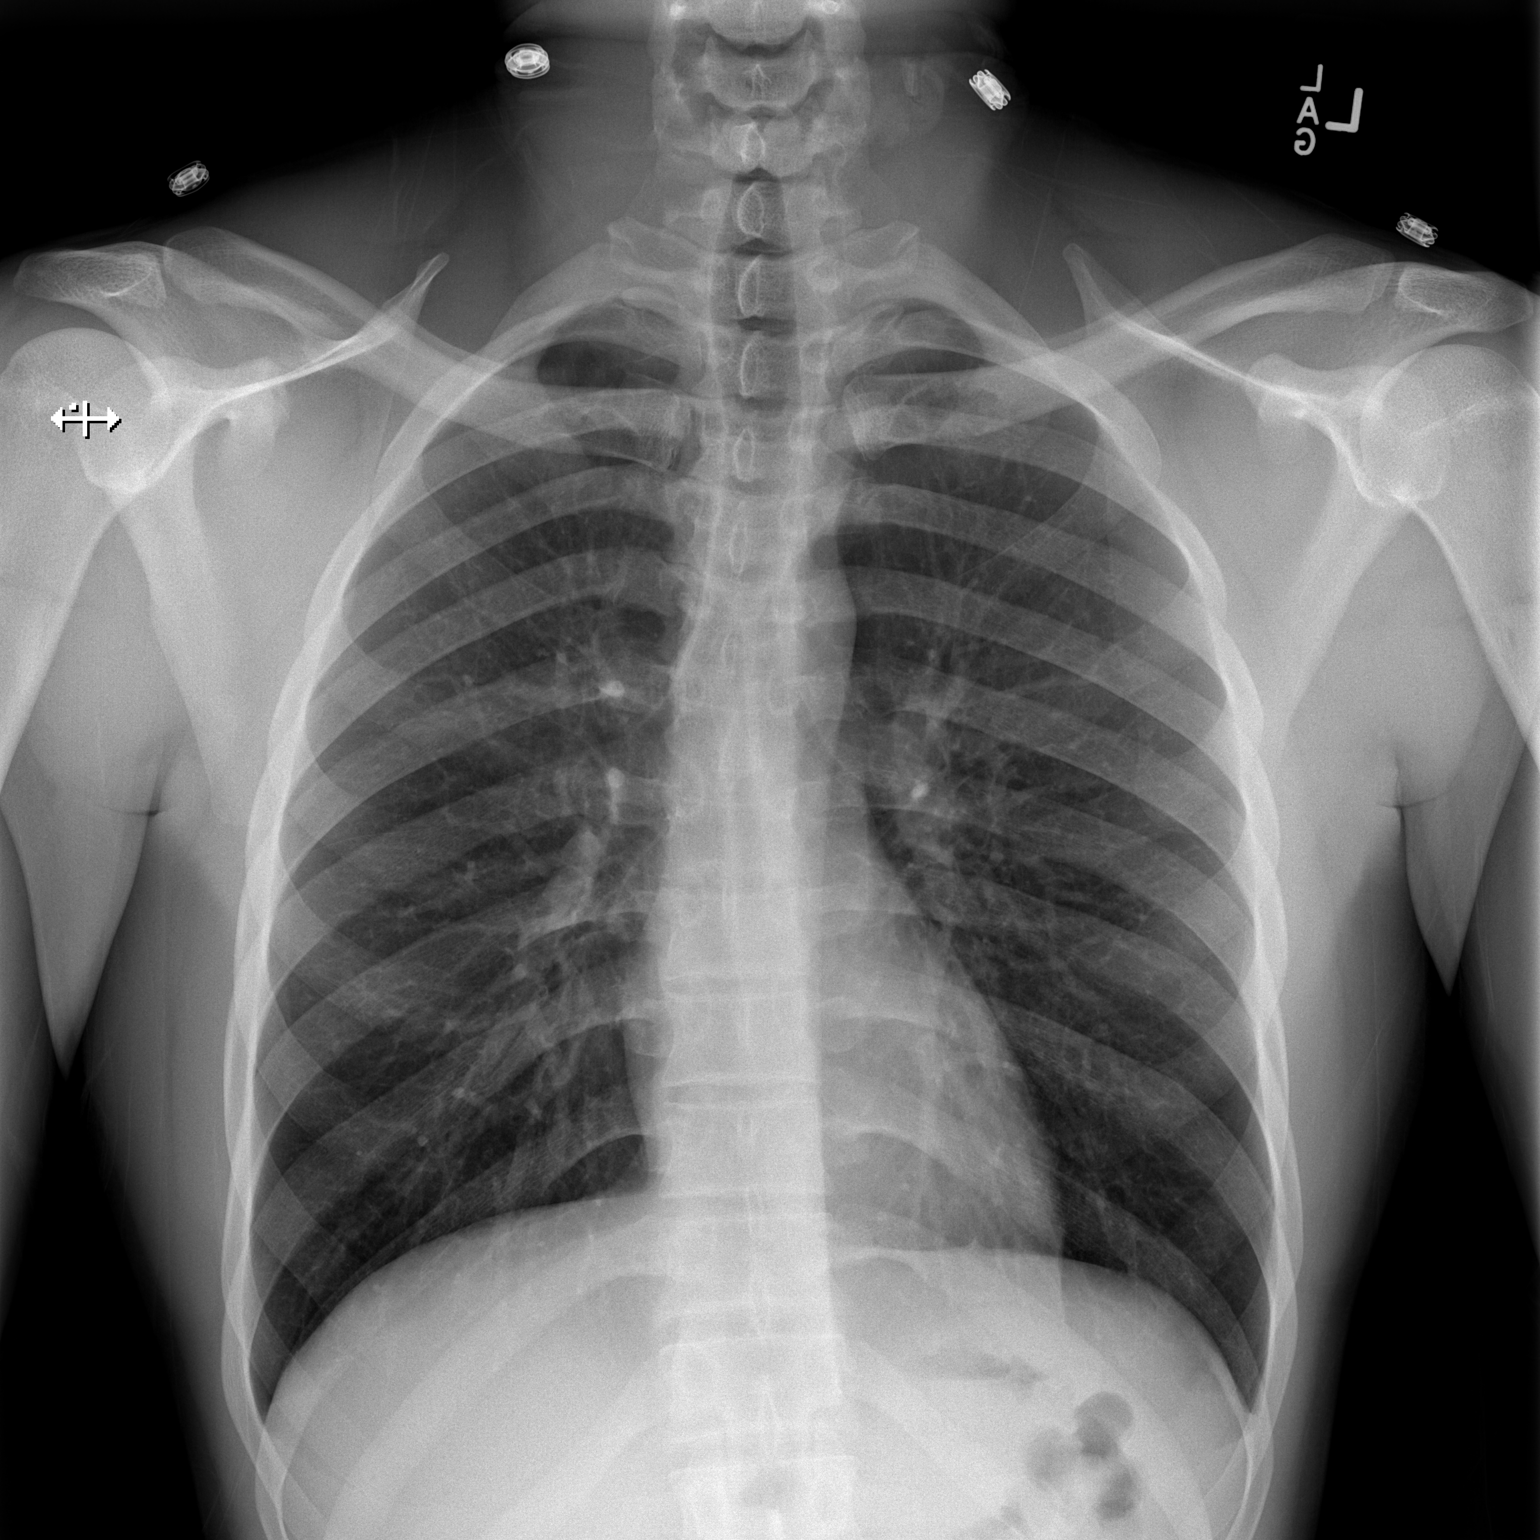

[w chest lat]
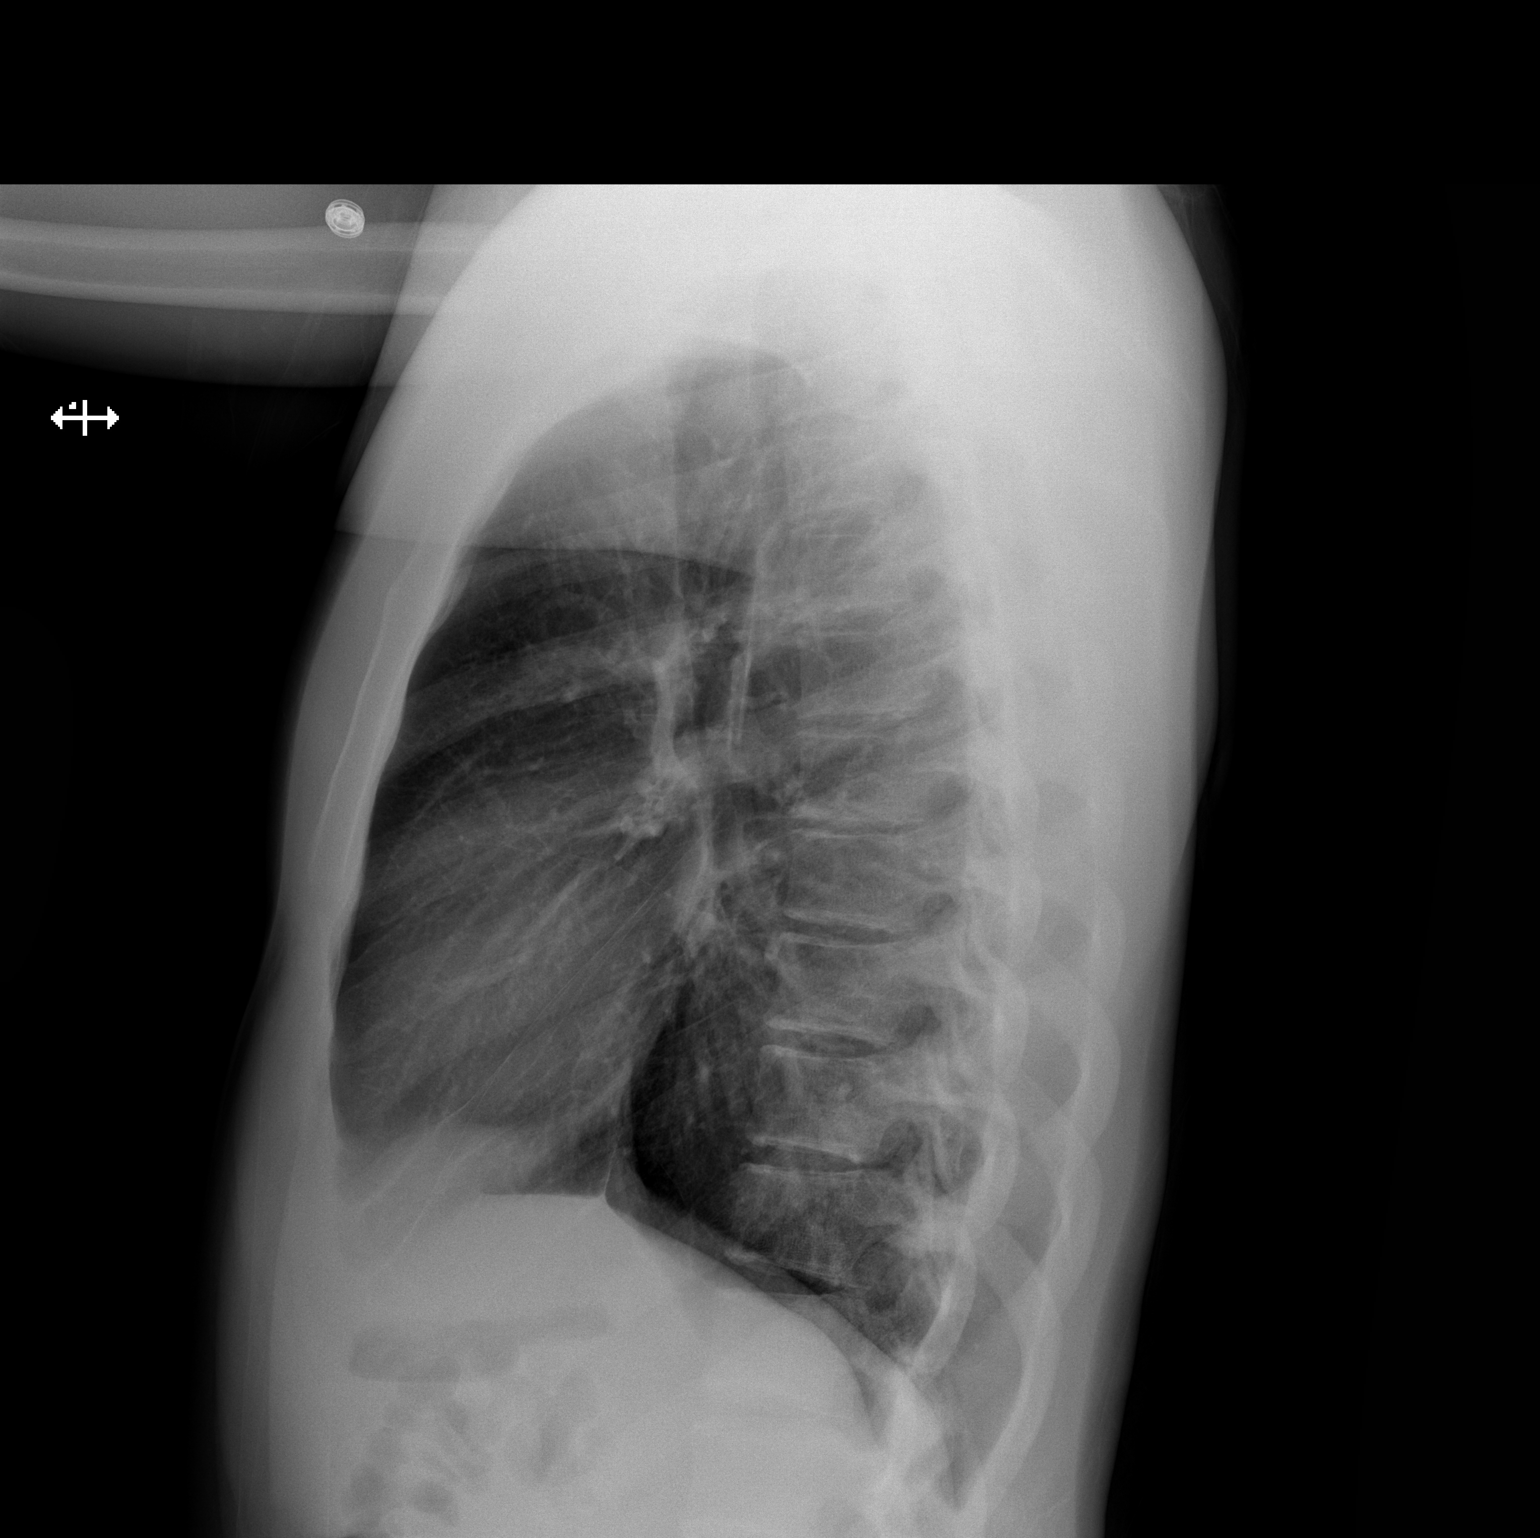

[2 of 2 positions shown; findings below may reference images not displayed]

FINDINGS: Heart size and mediastinal contours are within normal limits. Both
lungs are clear. Visualized skeletal structures are unremarkable.
IMPRESSION: Negative exam.

## 2016-01-14 ENCOUNTER — Emergency Department (HOSPITAL_COMMUNITY)
Admission: EM | Admit: 2016-01-14 | Discharge: 2016-01-15 | Disposition: A | Discharge status: Home / Self Care | Payer: BLUE CROSS/BLUE SHIELD | Attending: Emergency Medicine | Admitting: Emergency Medicine

## 2016-01-14 ENCOUNTER — Encounter (HOSPITAL_COMMUNITY): Payer: Self-pay | Admitting: Oncology

## 2016-01-14 DIAGNOSIS — K529 Noninfective gastroenteritis and colitis, unspecified: Secondary | ICD-10-CM | POA: Diagnosis not present

## 2016-01-14 DIAGNOSIS — J45909 Unspecified asthma, uncomplicated: Secondary | ICD-10-CM | POA: Insufficient documentation

## 2016-01-14 DIAGNOSIS — Z79899 Other long term (current) drug therapy: Secondary | ICD-10-CM | POA: Diagnosis not present

## 2016-01-14 DIAGNOSIS — R112 Nausea with vomiting, unspecified: Secondary | ICD-10-CM | POA: Diagnosis present

## 2016-01-14 DIAGNOSIS — F419 Anxiety disorder, unspecified: Secondary | ICD-10-CM | POA: Insufficient documentation

## 2016-01-14 MED ORDER — SODIUM CHLORIDE 0.9 % IV BOLUS (SEPSIS)
1000.0000 mL | Freq: Once | INTRAVENOUS | Status: AC
  Administered 2016-01-15: 1000 mL via INTRAVENOUS

## 2016-01-14 MED ORDER — ONDANSETRON HCL 4 MG/2ML IJ SOLN
4.0000 mg | Freq: Once | INTRAMUSCULAR | Status: AC
  Administered 2016-01-15: 4 mg via INTRAVENOUS
  Filled 2016-01-14: qty 2

## 2016-01-14 NOTE — ED Notes (Signed)
Pt c/o emesis x 20+ since 0300. Pt is here visiting mother, no one else vomiting in home, denies pain and diarrhea

## 2016-01-15 LAB — CBC WITH DIFFERENTIAL/PLATELET
Basophils Absolute: 0 10*3/uL (ref 0.0–0.1)
Basophils Relative: 0 %
Eosinophils Absolute: 0 10*3/uL (ref 0.0–0.7)
Eosinophils Relative: 0 %
HCT: 45 % (ref 39.0–52.0)
Hemoglobin: 15.6 g/dL (ref 13.0–17.0)
Lymphocytes Relative: 10 %
Lymphs Abs: 1.4 10*3/uL (ref 0.7–4.0)
MCH: 29.1 pg (ref 26.0–34.0)
MCHC: 34.7 g/dL (ref 30.0–36.0)
MCV: 84 fL (ref 78.0–100.0)
Monocytes Absolute: 0.6 10*3/uL (ref 0.1–1.0)
Monocytes Relative: 4 %
Neutro Abs: 12.4 10*3/uL — ABNORMAL HIGH (ref 1.7–7.7)
Neutrophils Relative %: 86 %
Platelets: 270 10*3/uL (ref 150–400)
RBC: 5.36 MIL/uL (ref 4.22–5.81)
RDW: 13.6 % (ref 11.5–15.5)
WBC: 14.5 10*3/uL — ABNORMAL HIGH (ref 4.0–10.5)

## 2016-01-15 LAB — COMPREHENSIVE METABOLIC PANEL
ALT: 26 U/L (ref 17–63)
AST: 36 U/L (ref 15–41)
Albumin: 4.4 g/dL (ref 3.5–5.0)
Alkaline Phosphatase: 79 U/L (ref 38–126)
Anion gap: 9 (ref 5–15)
BUN: 11 mg/dL (ref 6–20)
CO2: 28 mmol/L (ref 22–32)
Calcium: 9.3 mg/dL (ref 8.9–10.3)
Chloride: 102 mmol/L (ref 101–111)
Creatinine, Ser: 0.91 mg/dL (ref 0.61–1.24)
GFR calc Af Amer: >60 mL/min (ref >60)
GFR calc non Af Amer: >60 mL/min (ref >60)
Glucose, Bld: 99 mg/dL (ref 65–99)
Potassium: 3.9 mmol/L (ref 3.5–5.1)
Sodium: 139 mmol/L (ref 135–145)
Total Bilirubin: 0.8 mg/dL (ref 0.3–1.2)
Total Protein: 8.3 g/dL — ABNORMAL HIGH (ref 6.5–8.1)

## 2016-01-15 MED ORDER — ESCITALOPRAM OXALATE 10 MG PO TABS: 10.0000 mg | ORAL_TABLET | Freq: Every day | ORAL | Status: AC

## 2016-01-15 MED ORDER — SODIUM CHLORIDE 0.9 % IV BOLUS (SEPSIS)
1000.0000 mL | Freq: Once | INTRAVENOUS | Status: AC
  Administered 2016-01-15: 1000 mL via INTRAVENOUS

## 2016-01-15 MED ORDER — PROMETHAZINE HCL 25 MG PO TABS: 25.0000 mg | ORAL_TABLET | Freq: Three times a day (TID) | ORAL | Status: AC | PRN

## 2016-01-15 NOTE — ED Provider Notes (Signed)
CSN: [id number]     Arrival date & time 01/14/16  2200 History   First MD Initiated Contact with Patient 01/14/16 2314     Chief Complaint  Patient presents with  . Emesis     (Consider location/radiation/quality/duration/timing/severity/associated sxs/prior Treatment) HPI Patient presents to the emergency department with nausea, vomiting since 3 AM the patient states that he has vomited multiple times since then.  He states that nothing seems to make his condition better, but oral intake makes him vomit more.  Patient states that he did not take any medications prior to arrival. The patient denies chest pain, shortness of breath, headache,blurred vision, neck pain, fever, cough, weakness, numbness, dizziness, anorexia, edema, abdominal pain, nausea, vomiting, diarrhea, rash, back pain, dysuria, hematemesis, bloody stool, near syncope, or syncope. Past Medical History  Diagnosis Date  . Asthma     intermittent  . GERD (gastroesophageal reflux disease)   . Alcohol abuse   . Anxiety    History reviewed. No pertinent past surgical history. No family history on file. Social History  Substance Use Topics  . Smoking status: Never Smoker   . Smokeless tobacco: Former NeurosurgeonUser  . Alcohol Use: Yes     Comment: occ    Review of Systems All other systems negative except as documented in the HPI. All pertinent positives and negatives as reviewed in the HPI.  Allergies  Review of patient's allergies indicates no known allergies.  Home Medications   Prior to Admission medications   Medication Sig Start Date End Date Taking? Authorizing Provider  escitalopram (LEXAPRO) 10 MG tablet Take 10 mg by mouth daily.   Yes Historical Provider, MD  fexofenadine (ALLEGRA) 180 MG tablet Take 180 mg by mouth daily.   Yes Historical Provider, MD  LORazepam (ATIVAN) 1 MG tablet Take 0.5 mg by mouth every 6 (six) hours as needed for anxiety.   Yes Historical Provider, MD  MELATONIN PO Take 1 tablet by mouth  daily.   Yes Historical Provider, MD  Multiple Vitamin (MULTIVITAMIN WITH MINERALS) TABS tablet Take 1 tablet by mouth daily.   Yes Historical Provider, MD  famotidine (PEPCID) 20 MG tablet Take 1 tablet (20 mg total) by mouth 2 (two) times daily. Patient not taking: Reported on 01/14/2016 04/08/14   Ivonne AndrewPeter Dammen, PA-C  omeprazole (PRILOSEC) 20 MG capsule Take 1 capsule (20 mg total) by mouth daily. Patient not taking: Reported on 01/14/2016 04/08/14   Ivonne AndrewPeter Dammen, PA-C  sucralfate (CARAFATE) 1 GM/10ML suspension Take 10 mLs (1 g total) by mouth 4 (four) times daily -  with meals and at bedtime. Patient not taking: Reported on 01/14/2016 04/08/14   Ivonne AndrewPeter Dammen, PA-C   BP 125/59 mmHg  Pulse 69  Temp(Src) 98.1 F (36.7 C) (Oral)  Resp 20  Ht 6' (1.829 m)  Wt 77.111 kg  BMI 23.05 kg/m2  SpO2 98% Physical Exam  Constitutional: He is oriented to person, place, and time. He appears well-developed and well-nourished. No distress.  HENT:  Head: Normocephalic and atraumatic.  Mouth/Throat: Oropharynx is clear and moist.  Eyes: Pupils are equal, round, and reactive to light.  Neck: Normal range of motion. Neck supple.  Cardiovascular: Normal rate, regular rhythm and normal heart sounds.  Exam reveals no gallop and no friction rub.   No murmur heard. Pulmonary/Chest: Effort normal and breath sounds normal. No respiratory distress. He has no wheezes.  Abdominal: Soft. Bowel sounds are normal. He exhibits no distension. There is no tenderness.  Neurological: He is alert  and oriented to person, place, and time. He exhibits normal muscle tone. Coordination normal.  Skin: Skin is warm and dry. No rash noted. No erythema.  Psychiatric: He has a normal mood and affect. His behavior is normal.  Nursing note and vitals reviewed.   ED Course  Procedures (including critical care time) Labs Review Labs Reviewed  COMPREHENSIVE METABOLIC PANEL - Abnormal; Notable for the following:    Total Protein 8.3 (*)     All other components within normal limits  CBC WITH DIFFERENTIAL/PLATELET - Abnormal; Notable for the following:    WBC 14.5 (*)    Neutro Abs 12.4 (*)    All other components within normal limits  URINALYSIS, ROUTINE W REFLEX MICROSCOPIC (NOT AT [facility])    I have personally reviewed and evaluated these images and lab results as part of my medical decision-making.  Patient is feeling improved following IV fluids.  He will be given an oral fluid trial.  Patient is advised plan and all questions were answered.  I also advised him of his laboratory testing results.  He voices an understanding.  He is resting comfortably in the bed.  Patient is advised to return here as needed.  Patient will be treated for gastroenteritis based on his history of present illness and physical exam findings  Charlestine Night, PA-C 01/15/16 0150  Doug Sou, MD 01/15/16 1836

## 2016-01-15 NOTE — Discharge Instructions (Signed)
Return here as needed.  Follow-up with the primary care doctor.  Slowly increase her fluid intake.

## 2016-01-15 NOTE — ED Notes (Signed)
Pt is aware that urine specimen is needed.
